# Patient Record
Sex: Male | Born: 1995 | Race: White | Hispanic: No | Marital: Single | Smoking: Former smoker
Health system: Southern US, Community
[De-identification: ages and names within clinical notes are randomized; demographics above are authoritative.]

## PROBLEM LIST (undated history)

## (undated) DIAGNOSIS — N200 Calculus of kidney: Secondary | ICD-10-CM

---
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
## 2014-05-20 ENCOUNTER — Encounter (HOSPITAL_COMMUNITY): Payer: Self-pay | Admitting: Emergency Medicine

## 2014-05-20 ENCOUNTER — Emergency Department (HOSPITAL_COMMUNITY)
Admission: EM | Admit: 2014-05-20 | Discharge: 2014-05-20 | Disposition: A | Discharge status: Home / Self Care | Payer: PRIVATE HEALTH INSURANCE | Attending: Emergency Medicine | Admitting: Emergency Medicine

## 2014-05-20 DIAGNOSIS — L989 Disorder of the skin and subcutaneous tissue, unspecified: Secondary | ICD-10-CM | POA: Diagnosis present

## 2014-05-20 DIAGNOSIS — Z720 Tobacco use: Secondary | ICD-10-CM | POA: Diagnosis not present

## 2014-05-20 DIAGNOSIS — Z79899 Other long term (current) drug therapy: Secondary | ICD-10-CM | POA: Insufficient documentation

## 2014-05-20 DIAGNOSIS — K121 Other forms of stomatitis: Secondary | ICD-10-CM | POA: Diagnosis not present

## 2014-05-20 DIAGNOSIS — L723 Sebaceous cyst: Secondary | ICD-10-CM | POA: Diagnosis not present

## 2014-05-20 MED ORDER — SILVER NITRATE-POT NITRATE 75-25 % EX MISC
CUTANEOUS | Status: AC
  Filled 2014-05-20: qty 1

## 2014-05-20 NOTE — ED Provider Notes (Signed)
CSN: [id number]     Arrival date & time 05/20/14  0846 History   First MD Initiated Contact with Patient 05/20/14 [phone]     Chief Complaint  Patient presents with  . Skin Problem     (Consider location/radiation/quality/duration/timing/severity/associated sxs/prior Treatment) The history is provided by the patient.  Joel Ware is a 19 y.o. male who presents to the ED with a sore on his bottom lip that came about 3 weeks ago and has persisted. He states that he gets ulcers from his braces and from biting his lip during sleep at night but they usually last about 4 days and go away. He has used Ora Gel without relief. He has not taken any medications for pain.   He also complains of a lump under the right axilla that started 2 months ago or maybe longer. It has not increased in size and does not hurt. Patient has not discussed this with his PCP.   History reviewed. No pertinent past medical history. History reviewed. No pertinent past surgical history. History reviewed. No pertinent family history. History  Substance Use Topics  . Smoking status: Current Every Day Smoker -- 0.75 packs/day  . Smokeless tobacco: Not on file  . Alcohol Use: No    Review of Systems Negative except as stated in HPI   Allergies  Review of patient's allergies indicates no known allergies.  Home Medications   Prior to Admission medications   Medication Sig Start Date End Date Taking? Authorizing Provider  FLUoxetine (PROZAC) 20 MG capsule Take 20 mg by mouth every morning.    Historical Provider, MD   BP 117/62 mmHg  Pulse 63  Temp(Src) 97.8 F (36.6 C) (Oral)  Resp 18  Ht 6\' 1"  (1.854 m)  Wt 140 lb (63.504 kg)  BMI 18.47 kg/m2  SpO2 100% Physical Exam  Constitutional: He is oriented to person, place, and time. He appears well-developed and well-nourished. No distress.  HENT:  Head: Normocephalic.  Right Ear: Tympanic membrane normal.  Left Ear: Tympanic membrane normal.  Nose: Nose  normal.  Mouth/Throat: Uvula is midline and oropharynx is clear and moist.  There is an oral ulcer noted to the mucous membrane of the inside lower lip. Tender on exam.   Eyes: Conjunctivae and EOM are normal.  Neck: Normal range of motion. Neck supple.  Cardiovascular: Normal rate and regular rhythm.   Pulmonary/Chest: Effort normal and breath sounds normal.  Musculoskeletal: Normal range of motion.  Right axilla with BB size cystic area palpated, non tender. Radial pulses 2+, adequate circulation, full range of motion without pain.   Neurological: He is alert and oriented to person, place, and time. No cranial nerve deficit.  Skin: Skin is warm and dry.  Psychiatric: He has a normal mood and affect. His behavior is normal.  Nursing note and vitals reviewed.   ED Course  Procedures (including critical care time) Oral ulcer cauterized with silver nitrite.  Labs Review  MDM  19 y.o. male with oral ulcer and cyst to right axilla. He will treat the ulcer with topical and oral pain medication. He will follow up with his doctor for the cyst to the right axilla. Stable for discharge without any distress. Discussed with the patient and all questioned fully answered. He will return if any problems arise.  Final diagnoses:  Oral ulcer  Sebaceous cyst of right axilla      Janne NapoleonHope M Neese, NP 05/20/14 1321  Benny LennertJoseph L Zammit, MD 05/20/14 1415

## 2014-05-20 NOTE — Discharge Instructions (Signed)
Make an appointment to see your doctor to follow the cyst like area under your right arm. Return here as needed.

## 2014-05-20 NOTE — ED Notes (Signed)
Pt reports "sore" on lip x2 weeks. Pt also right armpit lymph node swelling x2 months. Pt denies any fever. nad noted.

## 2015-12-14 ENCOUNTER — Emergency Department (HOSPITAL_COMMUNITY)
Admission: EM | Admit: 2015-12-14 | Discharge: 2015-12-14 | Disposition: A | Discharge status: Home / Self Care | Payer: PRIVATE HEALTH INSURANCE | Attending: Emergency Medicine | Admitting: Emergency Medicine

## 2015-12-14 ENCOUNTER — Emergency Department (HOSPITAL_COMMUNITY): Payer: PRIVATE HEALTH INSURANCE

## 2015-12-14 ENCOUNTER — Encounter (HOSPITAL_COMMUNITY): Payer: Self-pay | Admitting: *Deleted

## 2015-12-14 DIAGNOSIS — S0993XA Unspecified injury of face, initial encounter: Secondary | ICD-10-CM | POA: Diagnosis present

## 2015-12-14 DIAGNOSIS — S0083XA Contusion of other part of head, initial encounter: Secondary | ICD-10-CM | POA: Diagnosis not present

## 2015-12-14 DIAGNOSIS — F17200 Nicotine dependence, unspecified, uncomplicated: Secondary | ICD-10-CM | POA: Diagnosis not present

## 2015-12-14 DIAGNOSIS — Y929 Unspecified place or not applicable: Secondary | ICD-10-CM | POA: Insufficient documentation

## 2015-12-14 DIAGNOSIS — S20311A Abrasion of right front wall of thorax, initial encounter: Secondary | ICD-10-CM | POA: Insufficient documentation

## 2015-12-14 DIAGNOSIS — Y9389 Activity, other specified: Secondary | ICD-10-CM | POA: Diagnosis not present

## 2015-12-14 DIAGNOSIS — Y999 Unspecified external cause status: Secondary | ICD-10-CM | POA: Diagnosis not present

## 2015-12-14 DIAGNOSIS — S022XXA Fracture of nasal bones, initial encounter for closed fracture: Secondary | ICD-10-CM | POA: Diagnosis not present

## 2015-12-14 DIAGNOSIS — T07XXXA Unspecified multiple injuries, initial encounter: Secondary | ICD-10-CM

## 2015-12-14 DIAGNOSIS — S1091XA Abrasion of unspecified part of neck, initial encounter: Secondary | ICD-10-CM | POA: Diagnosis not present

## 2015-12-14 MED ORDER — HYDROCODONE-ACETAMINOPHEN 5-325 MG PO TABS: 1.0000 | ORAL_TABLET | Freq: Four times a day (QID) | ORAL | 0 refills | Status: AC | PRN

## 2015-12-14 MED ORDER — HYDROCODONE-ACETAMINOPHEN 5-325 MG PO TABS
1.0000 | ORAL_TABLET | Freq: Once | ORAL | Status: AC
Start: 2015-12-14 — End: 2015-12-14
  Administered 2015-12-14: 1 via ORAL
  Filled 2015-12-14: qty 1

## 2015-12-14 NOTE — ED Notes (Signed)
Patient transported to X-ray 
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
## 2015-12-14 NOTE — ED Notes (Signed)
Pt returned from xray
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
## 2023-11-05 ENCOUNTER — Other Ambulatory Visit: Payer: Self-pay

## 2023-11-05 ENCOUNTER — Encounter (HOSPITAL_COMMUNITY): Payer: Self-pay | Admitting: Emergency Medicine

## 2023-11-05 ENCOUNTER — Emergency Department (HOSPITAL_COMMUNITY)
Admission: EM | Admit: 2023-11-05 | Discharge: 2023-11-05 | Disposition: A | Discharge status: Home / Self Care | Attending: Emergency Medicine | Admitting: Emergency Medicine

## 2023-11-05 ENCOUNTER — Emergency Department (HOSPITAL_COMMUNITY)

## 2023-11-05 DIAGNOSIS — N132 Hydronephrosis with renal and ureteral calculous obstruction: Secondary | ICD-10-CM | POA: Insufficient documentation

## 2023-11-05 DIAGNOSIS — N200 Calculus of kidney: Secondary | ICD-10-CM

## 2023-11-05 DIAGNOSIS — R109 Unspecified abdominal pain: Secondary | ICD-10-CM | POA: Diagnosis present

## 2023-11-05 HISTORY — DX: Calculus of kidney: N20.0

## 2023-11-05 LAB — HEPATIC FUNCTION PANEL
ALT: 25 U/L (ref 0–44)
AST: 24 U/L (ref 15–41)
Albumin: 4 g/dL (ref 3.5–5.0)
Alkaline Phosphatase: 72 U/L (ref 38–126)
Bilirubin, Direct: <0.1 mg/dL (ref 0.0–0.2)
Total Bilirubin: 0.6 mg/dL (ref 0.0–1.2)
Total Protein: 7.6 g/dL (ref 6.5–8.1)

## 2023-11-05 LAB — BASIC METABOLIC PANEL WITH GFR
Anion gap: 8 (ref 5–15)
BUN: 15 mg/dL (ref 6–20)
CO2: 27 mmol/L (ref 22–32)
Calcium: 8.9 mg/dL (ref 8.9–10.3)
Chloride: 101 mmol/L (ref 98–111)
Creatinine, Ser: 1.27 mg/dL — ABNORMAL HIGH (ref 0.61–1.24)
GFR, Estimated: >60 mL/min (ref >60)
Glucose, Bld: 94 mg/dL (ref 70–99)
Potassium: 3.5 mmol/L (ref 3.5–5.1)
Sodium: 136 mmol/L (ref 135–145)

## 2023-11-05 LAB — URINALYSIS, ROUTINE W REFLEX MICROSCOPIC
Bacteria, UA: NONE SEEN
Bilirubin Urine: NEGATIVE
Glucose, UA: NEGATIVE mg/dL
Ketones, ur: NEGATIVE mg/dL
Leukocytes,Ua: NEGATIVE
Nitrite: NEGATIVE
Protein, ur: 30 mg/dL — AB
Specific Gravity, Urine: 1.026 (ref 1.005–1.030)
pH: 5 (ref 5.0–8.0)

## 2023-11-05 LAB — CBC
HCT: 43 % (ref 39.0–52.0)
Hemoglobin: 14.5 g/dL (ref 13.0–17.0)
MCH: 27.7 pg (ref 26.0–34.0)
MCHC: 33.7 g/dL (ref 30.0–36.0)
MCV: 82.1 fL (ref 80.0–100.0)
Platelets: 247 10*3/uL (ref 150–400)
RBC: 5.24 MIL/uL (ref 4.22–5.81)
RDW: 12.1 % (ref 11.5–15.5)
WBC: 6.2 10*3/uL (ref 4.0–10.5)
nRBC: 0 % (ref 0.0–0.2)

## 2023-11-05 MED ORDER — ONDANSETRON HCL 4 MG/2ML IJ SOLN
4.0000 mg | Freq: Once | INTRAMUSCULAR | Status: AC
  Administered 2023-11-05: 4 mg via INTRAVENOUS
  Filled 2023-11-05: qty 2

## 2023-11-05 MED ORDER — KETOROLAC TROMETHAMINE 30 MG/ML IJ SOLN
30.0000 mg | Freq: Once | INTRAMUSCULAR | Status: AC
  Administered 2023-11-05: 30 mg via INTRAVENOUS
  Filled 2023-11-05: qty 1

## 2023-11-05 MED ORDER — HYDROMORPHONE HCL 1 MG/ML IJ SOLN
0.5000 mg | Freq: Once | INTRAMUSCULAR | Status: AC
  Administered 2023-11-05: 0.5 mg via INTRAVENOUS
  Filled 2023-11-05: qty 0.5

## 2023-11-05 MED ORDER — ONDANSETRON 4 MG PO TBDP: ORAL_TABLET | ORAL | 0 refills | Status: AC

## 2023-11-05 MED ORDER — HYDROMORPHONE HCL 1 MG/ML IJ SOLN
1.0000 mg | Freq: Once | INTRAMUSCULAR | Status: AC
  Administered 2023-11-05: 1 mg via INTRAVENOUS
  Filled 2023-11-05: qty 1

## 2023-11-05 MED ORDER — TAMSULOSIN HCL 0.4 MG PO CAPS: 0.4000 mg | ORAL_CAPSULE | Freq: Every day | ORAL | 0 refills | Status: AC

## 2023-11-05 MED ORDER — OXYCODONE-ACETAMINOPHEN 5-325 MG PO TABS: 1.0000 | ORAL_TABLET | Freq: Four times a day (QID) | ORAL | 0 refills | Status: AC | PRN

## 2023-11-05 NOTE — ED Notes (Signed)
 Bladder scan 0ml. MD made aware.

## 2023-11-05 NOTE — ED Notes (Signed)
 The patient is A&OX4, ambulatory at d/c with independent steady gait, NAD. Pt verbalized understanding of d/c instructions, prescription and follow up care. Pts mother states she is driving him home.

## 2023-11-05 NOTE — ED Triage Notes (Signed)
 Pt c/o left sided flank pain with difficulties urinating since yesterday. Pt states it dribbles when urinating. Pt states it looks like blood in urine. Pt has a hx of kidney stones but states pain is 10x worse

## 2023-11-05 NOTE — Discharge Instructions (Addendum)
Follow-up with [facility] next week.  Return if any problem

## 2023-11-06 NOTE — ED Provider Notes (Signed)
 [place] EMERGENCY DEPARTMENT AT [facility] Provider Note   CSN: [id number] Arrival date & time: 11/05/23  2133     Patient presents with: Flank Pain   Joel Ware is a 28 y.o. male.   Patient complains of left-sided flank pain.  Patient has a history of kidney stones.  The history is provided by the patient and medical records. No language interpreter was used.  Flank Pain This is a new problem. The current episode started 12 to 24 hours ago. The problem occurs constantly. The problem has not changed since onset.Pertinent negatives include no chest pain, no abdominal pain and no headaches. Nothing aggravates the symptoms. Nothing relieves the symptoms. He has tried nothing for the symptoms.       Prior to Admission medications   Medication Sig Start Date End Date Taking? Authorizing Provider  ondansetron (ZOFRAN-ODT) 4 MG disintegrating tablet 4mg  ODT q4 hours prn nausea/vomit 11/05/23  Yes Pinchus Weckwerth, MD  oxyCODONE-acetaminophen  (PERCOCET/ROXICET) 5-325 MG tablet Take 1 tablet by mouth every 6 (six) hours as needed for severe pain (pain score 7-10). 11/05/23  Yes Suzette Pac, MD  tamsulosin (FLOMAX) 0.4 MG CAPS capsule Take 1 capsule (0.4 mg total) by mouth daily. 11/05/23  Yes Sandon Yoho, MD  HYDROcodone -acetaminophen  (NORCO/VICODIN) 5-325 MG tablet Take 1 tablet by mouth every 6 (six) hours as needed for severe pain. 12/14/15   Dean Clarity, MD    Allergies: Patient has no known allergies.    Review of Systems  Constitutional:  Negative for appetite change and fatigue.  HENT:  Negative for congestion, ear discharge and sinus pressure.   Eyes:  Negative for discharge.  Respiratory:  Negative for cough.   Cardiovascular:  Negative for chest pain.  Gastrointestinal:  Negative for abdominal pain and diarrhea.  Genitourinary:  Positive for flank pain. Negative for frequency and hematuria.  Musculoskeletal:  Negative for back pain.  Skin:  Negative  for rash.  Neurological:  Negative for seizures and headaches.  Psychiatric/Behavioral:  Negative for hallucinations.     Updated Vital Signs BP (!) 153/94   Pulse 71   Temp 98.4 F (36.9 C) (Oral)   Resp 16   Ht 6' (1.829 m)   Wt 78 kg   SpO2 98%   BMI 23.33 kg/m   Physical Exam Vitals and nursing note reviewed.  Constitutional:      Appearance: He is well-developed.  HENT:     Head: Normocephalic.     Nose: Nose normal.  Eyes:     General: No scleral icterus.    Conjunctiva/sclera: Conjunctivae normal.  Neck:     Thyroid: No thyromegaly.  Cardiovascular:     Rate and Rhythm: Normal rate and regular rhythm.     Heart sounds: No murmur heard.    No friction rub. No gallop.  Pulmonary:     Breath sounds: No stridor. No wheezing or rales.  Chest:     Chest wall: No tenderness.  Abdominal:     General: There is no distension.     Tenderness: There is no abdominal tenderness. There is no rebound.  Genitourinary:    Comments: Tender left flank Musculoskeletal:        General: Normal range of motion.     Cervical back: Neck supple.  Lymphadenopathy:     Cervical: No cervical adenopathy.  Skin:    Findings: No erythema or rash.  Neurological:     Mental Status: He is alert and oriented to person, place,  and time.     Motor: No abnormal muscle tone.     Coordination: Coordination normal.  Psychiatric:        Behavior: Behavior normal.     (all labs ordered are listed, but only abnormal results are displayed) Labs Reviewed  URINALYSIS, ROUTINE W REFLEX MICROSCOPIC - Abnormal; Notable for the following components:      Result Value   Hgb urine dipstick LARGE (*)    Protein, ur 30 (*)    All other components within normal limits  BASIC METABOLIC PANEL WITH GFR - Abnormal; Notable for the following components:   Creatinine, Ser 1.27 (*)    All other components within normal limits  CBC  HEPATIC FUNCTION PANEL    EKG: None  Radiology: CT Renal Stone  Study Result Date: 11/05/2023 CLINICAL DATA:  Left-sided abdominal/flank pain with difficulty urinating. Blood in urine. History of kidney stones. EXAM: CT ABDOMEN AND PELVIS WITHOUT CONTRAST TECHNIQUE: Multidetector CT imaging of the abdomen and pelvis was performed following the standard protocol without IV contrast. RADIATION DOSE REDUCTION: This exam was performed according to the departmental dose-optimization program which includes automated exposure control, adjustment of the mA and/or kV according to patient size and/or use of iterative reconstruction technique. COMPARISON:  CT abdomen pelvis report from 03/03/2019. No images are available. FINDINGS: Lower chest: No acute abnormality. Hepatobiliary: Unremarkable liver. Normal gallbladder. No biliary dilation. Pancreas: Unremarkable. Spleen: Unremarkable. Adrenals/Urinary Tract: Normal adrenal glands. Bilateral nonobstructing punctate calyceal stones. Mild left hydroureteronephrosis upstream from a 2 mm stone in the distal left ureter. No right hydronephrosis. Bladder is unremarkable. Stomach/Bowel: Normal caliber large and small bowel. No bowel wall thickening. The appendix is normal.Stomach is within normal limits. Vascular/Lymphatic: No significant vascular findings are present. No enlarged abdominal or pelvic lymph nodes. Reproductive: Unremarkable. Other: No free intraperitoneal fluid or air. Musculoskeletal: No acute fracture. IMPRESSION: 1. Mild left hydroureteronephrosis upstream from a 2 mm stone in the distal left ureter. 2. Additional bilateral nonobstructing punctate nephrolithiasis. Electronically Signed   By: Norman Gatlin M.D.   On: 11/05/2023 22:51     Procedures   Medications Ordered in the ED  ketorolac (TORADOL) 30 MG/ML injection 30 mg (30 mg Intravenous Given 11/05/23 2202)  HYDROmorphone (DILAUDID) injection 0.5 mg (0.5 mg Intravenous Given 11/05/23 2202)  ondansetron (ZOFRAN) injection 4 mg (4 mg Intravenous Given 11/05/23 2202)   HYDROmorphone (DILAUDID) injection 1 mg (1 mg Intravenous Given 11/05/23 2300)  CT scan shows 2 mm left ureteral stone                                  Medical Decision Making Amount and/or Complexity of Data Reviewed Labs: ordered. Radiology: ordered.  Risk Prescription drug management.   Patient with a left-sided ureteral stone.  He is placed on pain medicine nausea medicine and Flomax and will follow-up with urology     Final diagnoses:  Kidney stone    ED Discharge Orders          Ordered    ondansetron (ZOFRAN-ODT) 4 MG disintegrating tablet        11/05/23 2316    oxyCODONE-acetaminophen  (PERCOCET/ROXICET) 5-325 MG tablet  Every 6 hours PRN        11/05/23 2316    tamsulosin (FLOMAX) 0.4 MG CAPS capsule  Daily        11/05/23 2316  Suzette Pac, MD 11/06/23 1135

## 2024-06-03 ENCOUNTER — Encounter (HOSPITAL_COMMUNITY): Payer: Self-pay

## 2024-06-03 ENCOUNTER — Other Ambulatory Visit: Payer: Self-pay

## 2024-06-03 ENCOUNTER — Emergency Department (HOSPITAL_COMMUNITY)
Admission: EM | Admit: 2024-06-03 | Discharge: 2024-06-03 | Disposition: A | Discharge status: Home / Self Care | Payer: Worker's Compensation | Attending: Emergency Medicine | Admitting: Emergency Medicine

## 2024-06-03 ENCOUNTER — Emergency Department (HOSPITAL_COMMUNITY)

## 2024-06-03 DIAGNOSIS — S0990XA Unspecified injury of head, initial encounter: Secondary | ICD-10-CM | POA: Diagnosis present

## 2024-06-03 DIAGNOSIS — Y990 Civilian activity done for income or pay: Secondary | ICD-10-CM | POA: Diagnosis not present

## 2024-06-03 DIAGNOSIS — M542 Cervicalgia: Secondary | ICD-10-CM | POA: Diagnosis not present

## 2024-06-03 DIAGNOSIS — W108XXA Fall (on) (from) other stairs and steps, initial encounter: Secondary | ICD-10-CM | POA: Insufficient documentation

## 2024-06-03 NOTE — Discharge Instructions (Signed)
 Workup in the emergency room reveals no evidence of brain bleed or cervical spine  fracture.  We recommend that you follow-up with your primary care doctor if you continue to have lingering moderate to severe headaches, dizziness, weakness or nausea/vomiting.

## 2024-06-03 NOTE — ED Provider Notes (Signed)
 " [place] EMERGENCY DEPARTMENT AT [facility] Provider Note   CSN: [id number] Arrival date & time: 06/03/24  1136     Patient presents with: Head Injury   Joel Ware is a 29 y.o. male.   HPI     29 year old male comes in with chief complaint of mechanical fall.  Patient works for [employer].  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  Patient did not lose consciousness, but he was dazed and had some nausea.  Patient currently complains of headache and neck pain.  He denies any one-sided weakness, numbness, slurred speech, vomiting, vision changes.  Patient does not take any blood thinners.  Prior to Admission medications  Medication Sig Start Date End Date Taking? Authorizing Provider  HYDROcodone -acetaminophen  (NORCO/VICODIN) 5-325 MG tablet Take 1 tablet by mouth every 6 (six) hours as needed for severe pain. 12/14/15   Dean Clarity, MD  ondansetron  (ZOFRAN -ODT) 4 MG disintegrating tablet 4mg  ODT q4 hours prn nausea/vomit 11/05/23   Zammit, Joseph, MD  oxyCODONE -acetaminophen  (PERCOCET/ROXICET) 5-325 MG tablet Take 1 tablet by mouth every 6 (six) hours as needed for severe pain (pain score 7-10). 11/05/23   Suzette Pac, MD  tamsulosin  (FLOMAX ) 0.4 MG CAPS capsule Take 1 capsule (0.4 mg total) by mouth daily. 11/05/23   Suzette Pac, MD    Allergies: Patient has no known allergies.    Review of Systems  All other systems reviewed and are negative.   Updated Vital Signs BP 106/60   Pulse 65   Temp 98.2 F (36.8 C) (Oral)   Resp 18   Ht 6' (1.829 m)   Wt 77.1 kg   SpO2 99%   BMI 23.06 kg/m   Physical Exam Vitals and nursing note reviewed.  Constitutional:      Appearance: He is well-developed.  HENT:     Head: Atraumatic.  Eyes:     Extraocular Movements: Extraocular movements intact.     Pupils: Pupils are equal, round, and reactive to light.     Comments: No nystagmus, bedside  finger counting normal, visual fields intact  Neck:     Comments: In a c-collar, has midline C-spine tenderness over C3-C4 Cardiovascular:     Rate and Rhythm: Normal rate.  Pulmonary:     Effort: Pulmonary effort is normal.  Skin:    General: Skin is warm.  Neurological:     Mental Status: He is alert and oriented to person, place, and time.     Coordination: Coordination normal.     (all labs ordered are listed, but only abnormal results are displayed) Labs Reviewed - No data to display  EKG: None  Radiology: CT Cervical Spine Wo Contrast Result Date: 06/03/2024 EXAM: CT CERVICAL SPINE WITHOUT CONTRAST 06/03/2024 03:06:04 PM TECHNIQUE: CT of the cervical spine was performed without the administration of intravenous contrast. Multiplanar reformatted images are provided for review. Automated exposure control, iterative reconstruction, and/or weight based adjustment of the mA/kV was utilized to reduce the radiation dose to as low as reasonably achievable. COMPARISON: None available. CLINICAL HISTORY: Polytrauma, blunt. FINDINGS: BONES AND ALIGNMENT: Straightening of the normal cervical lordosis. No listhesis. No acute fracture. DEGENERATIVE CHANGES: No significant degenerative changes. SOFT TISSUES: No prevertebral soft tissue swelling. IMPRESSION: 1. No evidence of acute cervical spine fracture or traumatic malalignment. Electronically signed by: Dasie Hamburg MD 06/03/2024 03:23 PM EST RP Workstation: [id number]   CT Head Wo Contrast Result  Date: 06/03/2024 EXAM: CT HEAD WITHOUT CONTRAST 06/03/2024 03:06:04 PM TECHNIQUE: CT of the head was performed without the administration of intravenous contrast. Automated exposure control, iterative reconstruction, and/or weight based adjustment of the mA/kV was utilized to reduce the radiation dose to as low as reasonably achievable. COMPARISON: CT head 12/14/2015. CLINICAL HISTORY: Head trauma, moderate to severe. Fall down steps. Posterior head  strike. No loss of consciousness. FINDINGS: BRAIN AND VENTRICLES: There is no evidence of an acute infarct, intracranial hemorrhage, mass, midline shift, hydrocephalus, or extra-axial fluid collection. Cerebral volume is normal. ORBITS: No acute abnormality. SINUSES: No acute abnormality. SOFT TISSUES AND SKULL: No acute soft tissue abnormality. No skull fracture. IMPRESSION: 1. Negative head CT. Electronically signed by: Dasie Hamburg MD 06/03/2024 03:19 PM EST RP Workstation: [id number]     Procedures   Medications Ordered in the ED - No data to display  Clinical Course as of 06/03/24 1541  Wed Jun 03, 2024  1349 At the moment, patient does not want anything for pain. [AN]  1541 CT Head Wo Contrast CT head was independently interpreted by me.  There is no evidence of bleed. [AN]    Clinical Course User Index [AN] Charlyn Sora, MD                                 Medical Decision Making Amount and/or Complexity of Data Reviewed Radiology: ordered.   29 year old patient comes in with chief complaint of head injury.  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  He is complaining of head pain, neck pain.  Did not have any loss of consciousness, but  he was dazed and initially nauseous.  Patient has no focal neurodeficits or complaints right now.  On exam, he does have midline C-spine tenderness.  Differential diagnosis includes blunt trauma with contusion, hematoma, concussion, traumatic intracranial bleed, cervical spine fracture, whiplash injury and cervical strain  Plan is to get CT brain and C-spine, which if negative patient can be discharged with head injury precautions.  Final diagnoses:  Injury of head, initial encounter    ED Discharge Orders     None          Charlyn Sora, MD 06/03/24 1541  "

## 2024-06-03 NOTE — ED Triage Notes (Signed)
 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
